# Patient Record
Sex: Female | Born: 1980 | Race: Black or African American | Hispanic: No | Marital: Single | Smoking: Never smoker
Health system: Southern US, Community
[De-identification: ages and names within clinical notes are randomized; demographics above are authoritative.]

## PROBLEM LIST (undated history)

## (undated) DIAGNOSIS — N289 Disorder of kidney and ureter, unspecified: Secondary | ICD-10-CM

---

## 2007-11-08 ENCOUNTER — Emergency Department (HOSPITAL_COMMUNITY): Admission: EM | Admit: 2007-11-08 | Discharge: 2007-11-09 | Payer: Self-pay | Admitting: Emergency Medicine

## 2007-12-20 ENCOUNTER — Emergency Department (HOSPITAL_COMMUNITY): Admission: EM | Admit: 2007-12-20 | Discharge: 2007-12-20 | Payer: Self-pay | Admitting: Family Medicine

## 2008-09-01 ENCOUNTER — Emergency Department (HOSPITAL_COMMUNITY): Admission: EM | Admit: 2008-09-01 | Discharge: 2008-09-01 | Payer: Self-pay | Admitting: Emergency Medicine

## 2009-03-28 ENCOUNTER — Emergency Department (HOSPITAL_COMMUNITY): Admission: EM | Admit: 2009-03-28 | Discharge: 2009-03-28 | Payer: Self-pay | Admitting: Emergency Medicine

## 2009-04-15 ENCOUNTER — Emergency Department (HOSPITAL_COMMUNITY): Admission: EM | Admit: 2009-04-15 | Discharge: 2009-04-15 | Payer: Self-pay | Admitting: Emergency Medicine

## 2010-02-18 ENCOUNTER — Emergency Department (HOSPITAL_COMMUNITY)
Admission: EM | Admit: 2010-02-18 | Discharge: 2010-02-18 | Payer: Self-pay | Source: Home / Self Care | Admitting: Family Medicine

## 2010-04-28 ENCOUNTER — Emergency Department (HOSPITAL_COMMUNITY): Payer: 59

## 2010-04-28 ENCOUNTER — Emergency Department (HOSPITAL_COMMUNITY)
Admission: EM | Admit: 2010-04-28 | Discharge: 2010-04-28 | Disposition: A | Discharge status: Home / Self Care | Payer: 59 | Attending: Emergency Medicine | Admitting: Emergency Medicine

## 2010-04-28 DIAGNOSIS — O200 Threatened abortion: Secondary | ICD-10-CM | POA: Insufficient documentation

## 2010-04-28 DIAGNOSIS — N949 Unspecified condition associated with female genital organs and menstrual cycle: Secondary | ICD-10-CM | POA: Insufficient documentation

## 2010-04-28 DIAGNOSIS — M545 Low back pain, unspecified: Secondary | ICD-10-CM | POA: Insufficient documentation

## 2010-04-28 LAB — CBC
MCH: 29.6 pg (ref 26.0–34.0)
Platelets: 253 10*3/uL (ref 150–400)
RDW: 13.2 % (ref 11.5–15.5)
WBC: 8.1 10*3/uL (ref 4.0–10.5)

## 2010-04-28 LAB — URINE MICROSCOPIC-ADD ON

## 2010-04-28 LAB — URINALYSIS, ROUTINE W REFLEX MICROSCOPIC
Ketones, ur: NEGATIVE mg/dL
Leukocytes, UA: NEGATIVE
Nitrite: NEGATIVE
Specific Gravity, Urine: 1.022 (ref 1.005–1.030)
Urobilinogen, UA: 0.2 mg/dL (ref 0.0–1.0)
pH: 6 (ref 5.0–8.0)

## 2010-04-28 LAB — WET PREP, GENITAL: Trich, Wet Prep: NONE SEEN

## 2010-04-28 LAB — ABO/RH: ABO/RH(D): O POS

## 2010-04-29 LAB — GC/CHLAMYDIA PROBE AMP, GENITAL: Chlamydia, DNA Probe: NEGATIVE

## 2010-05-02 LAB — DIFFERENTIAL
Basophils Relative: 0 % (ref 0–1)
Eosinophils Absolute: 0.1 10*3/uL (ref 0.0–0.7)
Lymphocytes Relative: 25 % (ref 12–46)
Neutro Abs: 7 10*3/uL (ref 1.7–7.7)

## 2010-05-02 LAB — CBC
MCHC: 34.7 g/dL (ref 30.0–36.0)
RBC: 3.63 MIL/uL — ABNORMAL LOW (ref 3.87–5.11)
RDW: 13.4 % (ref 11.5–15.5)
WBC: 10.1 10*3/uL (ref 4.0–10.5)

## 2010-05-02 LAB — POCT I-STAT, CHEM 8
BUN: 7 mg/dL (ref 6–23)
Glucose, Bld: 93 mg/dL (ref 70–99)
Hemoglobin: 11.6 g/dL — ABNORMAL LOW (ref 12.0–15.0)
TCO2: 23 mmol/L (ref 0–100)

## 2010-05-02 LAB — URINE MICROSCOPIC-ADD ON

## 2010-05-02 LAB — URINALYSIS, ROUTINE W REFLEX MICROSCOPIC
Glucose, UA: NEGATIVE mg/dL
Ketones, ur: NEGATIVE mg/dL
Leukocytes, UA: NEGATIVE
Nitrite: NEGATIVE
Protein, ur: NEGATIVE mg/dL

## 2010-05-11 ENCOUNTER — Other Ambulatory Visit (HOSPITAL_COMMUNITY): Payer: Self-pay | Admitting: Obstetrics and Gynecology

## 2010-05-11 DIAGNOSIS — O3680X0 Pregnancy with inconclusive fetal viability, not applicable or unspecified: Secondary | ICD-10-CM

## 2010-05-12 ENCOUNTER — Ambulatory Visit (HOSPITAL_COMMUNITY)
Admission: RE | Admit: 2010-05-12 | Discharge: 2010-05-12 | Disposition: A | Discharge status: Home / Self Care | Payer: 59 | Source: Ambulatory Visit | Attending: Obstetrics and Gynecology | Admitting: Obstetrics and Gynecology

## 2010-05-12 ENCOUNTER — Other Ambulatory Visit (HOSPITAL_COMMUNITY): Payer: Self-pay | Admitting: Obstetrics and Gynecology

## 2010-05-12 DIAGNOSIS — O3680X0 Pregnancy with inconclusive fetal viability, not applicable or unspecified: Secondary | ICD-10-CM

## 2010-05-12 DIAGNOSIS — Z3689 Encounter for other specified antenatal screening: Secondary | ICD-10-CM | POA: Insufficient documentation

## 2010-11-09 LAB — POCT URINALYSIS DIP (DEVICE)
Nitrite: NEGATIVE
Protein, ur: 30 mg/dL — AB
Urobilinogen, UA: 0.2 mg/dL (ref 0.0–1.0)
pH: 7.5 (ref 5.0–8.0)

## 2010-11-09 LAB — WET PREP, GENITAL
Trich, Wet Prep: NONE SEEN
Yeast Wet Prep HPF POC: NONE SEEN

## 2011-04-13 ENCOUNTER — Encounter (INDEPENDENT_AMBULATORY_CARE_PROVIDER_SITE_OTHER): Payer: Self-pay | Admitting: Surgery

## 2011-04-13 ENCOUNTER — Encounter (INDEPENDENT_AMBULATORY_CARE_PROVIDER_SITE_OTHER): Payer: 59 | Admitting: Surgery

## 2011-04-14 ENCOUNTER — Telehealth (INDEPENDENT_AMBULATORY_CARE_PROVIDER_SITE_OTHER): Payer: Self-pay | Admitting: Surgery

## 2011-04-14 ENCOUNTER — Ambulatory Visit (INDEPENDENT_AMBULATORY_CARE_PROVIDER_SITE_OTHER): Payer: 59 | Admitting: Surgery

## 2011-04-14 ENCOUNTER — Encounter (INDEPENDENT_AMBULATORY_CARE_PROVIDER_SITE_OTHER): Payer: Self-pay | Admitting: Surgery

## 2011-04-14 VITALS — BP 88/67 | HR 68 | Temp 98.0°F | Resp 12 | Ht 64.0 in | Wt 126.8 lb

## 2011-04-14 DIAGNOSIS — K602 Anal fissure, unspecified: Secondary | ICD-10-CM

## 2011-04-14 NOTE — Progress Notes (Signed)
[facility]  Ovidio Kin, MD,  FACS [address] [place]  [address] [place], [place]    [address] Phone:  [phone] FAX:  [phone]   Re:   Katlyne Nishida DOB:   09-04-1980 MRN:   [id number]  ASSESSMENT AND PLAN: 1.  Rectal pain - posterior anal fissure.  Plan stool softeners, Diltiazem cream, sitz baths at least 3 x per day, drink plenty of liquids, increase the fiber in your diet.  Given literature on rectal disease.  See me back in one month to see how she is doing.  2.  History of kidney stones.  Unsure of urologist name.  HISTORY OF PRESENT ILLNESS: Chief Complaint  Patient presents with  . Rectal Pain    Lynett Ades is a 31 y.o. (DOB: 1980/04/14)  AA female who is a patient of Dr. Ronnell Freshwater and comes to me today for rectal pain.  The patient sees Dr. Hilbert Bible or her medical problems.  About 4 weeks ago she noticed increasing rectal pain. She has had rectal bleeding on and off for some time. She had a colonoscopy 4 years ago in [facility] for the rectal bleeding.  She admits to having a little bit of hard stools normally.  The pain is increased and she has tried Preparation H and Tucks. She tried ice pack to her bottom. She could not tolerate suppositories. She has tried sitz baths, but can only get 2 in per day. She saw Dr. Hilbert Bible yesterday who referred her to her office. She has had no prior history of hemorrhoids or anal fissure.  ROS:  History of kidney stones.  Had lithotripsy about 2 years ago.  Cannot remember the name of her urologist.  Social:  Unmarried.  2 children - ages 21 and 35. 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  PHYSICAL EXAM: BP 88/67  Pulse 68  Temp(Src) 98 F (36.7 C) (Temporal)  Resp 12  Ht 5\' 4"  (1.626 m)  Wt 126 lb 12.8 oz (57.516 kg)  BMI 21.77 kg/m2  General: AA who is alert and generally healthy appearing.  HEENT: Normal. Pupils equal. Good dentition.  Lungs: Clear to auscultation and symmetric  breath sounds. Heart:  RRR. No murmur or rub. Abdomen: Soft. No mass. No tenderness. No hernia. Normal bowel sounds.  No abdominal scars. Rectal: Significant rectal spasm with evidence of posterior anal fissure.  No hemorrhoids.  I could not do much of a rectal exam because of the pain.  Extremities:  Good strength and ROM  in upper and lower extremities.  Tattoos arms, back and legs. Neurologic:  Grossly intact to motor and sensory function. Psychiatric: Has normal mood and affect. Behavior is normal.   DATA REVIEWED: Dr. Ebony Hail note  Ovidio Kin, MD, FACS Office:  [phone]

## 2011-04-14 NOTE — Patient Instructions (Signed)
1. Diltiazem cream 2% to rectum 4 times per day.   2. Stool softener - Colace.   3. Sitz bath 3 times per day (use the tub).   4. Topical agents like Prep H - okay.   

## 2011-05-04 ENCOUNTER — Telehealth (INDEPENDENT_AMBULATORY_CARE_PROVIDER_SITE_OTHER): Payer: Self-pay

## 2011-05-04 NOTE — Telephone Encounter (Signed)
The patient called to request a refill on her Diltiazem because the fissure has flared up.  I called in a refill of the 2% Cream for 30 days plus 5 refills per Dr Ezzard Standing to [facility] [phone].  Pt aware.

## 2011-05-17 ENCOUNTER — Encounter (INDEPENDENT_AMBULATORY_CARE_PROVIDER_SITE_OTHER): Payer: 59 | Admitting: Surgery

## 2011-07-27 ENCOUNTER — Encounter (INDEPENDENT_AMBULATORY_CARE_PROVIDER_SITE_OTHER): Payer: 59 | Admitting: Surgery

## 2011-08-02 ENCOUNTER — Encounter (INDEPENDENT_AMBULATORY_CARE_PROVIDER_SITE_OTHER): Payer: Self-pay | Admitting: Surgery

## 2014-03-12 ENCOUNTER — Encounter (HOSPITAL_COMMUNITY): Payer: Self-pay | Admitting: Emergency Medicine

## 2014-03-12 ENCOUNTER — Emergency Department (HOSPITAL_COMMUNITY)
Admission: EM | Admit: 2014-03-12 | Discharge: 2014-03-12 | Disposition: A | Discharge status: Home / Self Care | Payer: Managed Care, Other (non HMO) | Attending: Emergency Medicine | Admitting: Emergency Medicine

## 2014-03-12 DIAGNOSIS — T402X1A Poisoning by other opioids, accidental (unintentional), initial encounter: Secondary | ICD-10-CM | POA: Insufficient documentation

## 2014-03-12 DIAGNOSIS — T50905A Adverse effect of unspecified drugs, medicaments and biological substances, initial encounter: Secondary | ICD-10-CM

## 2014-03-12 DIAGNOSIS — Y9289 Other specified places as the place of occurrence of the external cause: Secondary | ICD-10-CM | POA: Insufficient documentation

## 2014-03-12 DIAGNOSIS — Y998 Other external cause status: Secondary | ICD-10-CM | POA: Insufficient documentation

## 2014-03-12 DIAGNOSIS — Y9389 Activity, other specified: Secondary | ICD-10-CM | POA: Insufficient documentation

## 2014-03-12 DIAGNOSIS — X58XXXA Exposure to other specified factors, initial encounter: Secondary | ICD-10-CM | POA: Insufficient documentation

## 2014-03-12 NOTE — ED Provider Notes (Signed)
CSN: [id number]     Arrival date & time 03/12/14  1215 History   First MD Initiated Contact with Patient 03/12/14 1300     Chief Complaint  Patient presents with  . Drug Overdose   HPI The patient states she took 2 hydrocodone today because she wanted to get some rest. Patient had an old prescription in the house.   The patient's friend who resides with her found her asleep and could not wake her up. Is concerned that something was wrong. He called 911. Patient thinks he may have overreacted.  The patient denies any complaints. She denies any suicidal or homicidal ideation. She was not taking these medications to harm herself. Nothing is actually hurting her either. Patient does not want to be in the emergency room and is asking if she can go home. History reviewed. No pertinent past medical history. History reviewed. No pertinent past surgical history. No family history on file. History  Substance Use Topics  . Smoking status: Never Smoker   . Smokeless tobacco: Not on file  . Alcohol Use: No   OB History    No data available     Review of Systems  All other systems reviewed and are negative.     Allergies  Review of patient's allergies indicates no known allergies.  Home Medications   Prior to Admission medications   Medication Sig Start Date End Date Taking? Authorizing Provider  Multiple Vitamin (MULTIVITAMIN WITH MINERALS) TABS tablet Take 1 tablet by mouth daily.   Yes Historical Provider, MD   BP 111/68 mmHg  Pulse 61  Temp(Src) 99 F (37.2 C) (Oral)  Resp 16  SpO2 99% Physical Exam  Constitutional: She appears well-developed and well-nourished. No distress.  HENT:  Head: Normocephalic and atraumatic.  Right Ear: External ear normal.  Left Ear: External ear normal.  Eyes: Conjunctivae are normal. Right eye exhibits no discharge. Left eye exhibits no discharge. No scleral icterus.  Neck: Neck supple. No tracheal deviation present.  Cardiovascular: Normal rate,  regular rhythm and intact distal pulses.   Pulmonary/Chest: Effort normal and breath sounds normal. No stridor. No respiratory distress. She has no wheezes. She has no rales.  Abdominal: Soft. Bowel sounds are normal. She exhibits no distension. There is no tenderness. There is no rebound and no guarding.  Musculoskeletal: She exhibits no edema or tenderness.  Neurological: She is alert. She has normal strength. No cranial nerve deficit (no facial droop, extraocular movements intact, no slurred speech) or sensory deficit. She exhibits normal muscle tone. She displays no seizure activity. Coordination normal.  Skin: Skin is warm and dry. No rash noted.  Psychiatric: She has a normal mood and affect. Her speech is not rapid and/or pressured, not delayed and not tangential. She is not withdrawn. Thought content is not delusional. She expresses no homicidal and no suicidal ideation.  Nursing note and vitals reviewed.   ED Course  Procedures (including critical care time) Labs Review Labs Reviewed - No data to display  Imaging Review No results found.   EKG Interpretation None      MDM   Final diagnoses:  Medication adverse effect, initial encounter    Patient is denying suicidal or homicidal ideation. Alert and awake during my exam. The patient's friend also is not concerned that she was trying to harm herself.  The patient contracts for safety. She does not want any further medical examination. I think this is reasonable  At this time there does not appear to  be any evidence of an acute emergency medical condition and the patient appears stable for discharge with appropriate outpatient follow up.    Linwood DibblesJon Romeka Scifres, MD 03/12/14 [phone]

## 2014-03-12 NOTE — ED Notes (Addendum)
From home via [facility], pt sts she took 2 of her own hydrocodone in order to sleep and that a friend called EMS bc she was excessively sedated. Denies SI/HI to EMS and ED staff.  With stimulation from EMS, pt awake, ambulatory and with stable vitals, CBG 101, 100% on RA.

## 2014-03-12 NOTE — Discharge Instructions (Signed)
Diphenhydramine capsules or tablets [Insomnia] What is this medicine? DIPHENHYDRAMINE (dye fen HYE dra meen) is an antihistamine. This medicine is used to treat occasional sleeplesness. This medicine may be used for other purposes; ask your health care provider or pharmacist if you have questions. COMMON BRAND NAME(S): Aid to Sleep, Compoz Nighttime Sleep Aid, Nighttime Sleep Aid, Nytol, Simply Sleep, Sleep Tabs, Sominex, Unisom, Vicks Qlearquil Nighttime Allergy Relief, Vicks ZzzQuil Nightime Sleep-Aid What should I tell my health care provider before I take this medicine? They need to know if you have any of these conditions: -glaucoma -high blood pressure or heart disease -liver disease -lung or breathing disease, like asthma -pain or trouble passing urine -prostate trouble -ulcers or other stomach problems -an unusual or allergic reaction to diphenhydramine, other medicines foods, dyes, or preservatives such as sulfites -pregnant or trying to get pregnant -breast-feeding How should I use this medicine? Take this medicine by mouth with a full glass of water. Follow the directions on the prescription label. Do not take your medicine more often than directed. Talk to your pediatrician regarding the use of this medicine in children. While this drug may be prescribed for children as young as 12 years old for selected conditions, precautions do apply. Patients over 65 years old may have a stronger reaction and need a smaller dose. Overdosage: If you think you have taken too much of this medicine contact a poison control center or emergency room at once. NOTE: This medicine is only for you. Do not share this medicine with others. What if I miss a dose? If you miss a dose, take it as soon as you can. If it is almost time for your next dose, take only that dose. Do not take double or extra doses. What may interact with this medicine? Do not take this medicine with any of the following  medications: -MAOIs like Carbex, Eldepryl, Marplan, Nardil, and Parnate This medicine may also interact with the following medications: -alcohol -barbiturates like phenobarbital -medicines for bladder spasm like oxybutynin, tolterodine -medicines for blood pressure -medicines for depression, anxiety, or psychotic disturbances -medicines for movement abnormalities or Parkinson's disease -medicines for sleep -other medicines for cold, cough, or allergy -some medicines for the stomach like chlordiazepoxide, dicyclomine This list may not describe all possible interactions. Give your health care provider a list of all the medicines, herbs, non-prescription drugs, or dietary supplements you use. Also tell them if you smoke, drink alcohol, or use illegal drugs. Some items may interact with your medicine. What should I watch for while using this medicine? Visit your doctor or health care professional for regular check ups. Tell your doctor or health care professional if your symptoms do not start to get better or if they get worse. Your mouth may get dry. Chewing sugarless gum or sucking hard candy, and drinking plenty of water may help. Contact your doctor if the problem does not go away or is severe. This medicine may cause dry eyes and blurred vision. If you wear contact lenses you may feel some discomfort. Lubricating drops may help. See your eye doctor if the problem does not go away or is severe. You may get drowsy or dizzy. Do not drive, use machinery, or do anything that needs mental alertness until you know how this medicine affects you. Do not stand or sit up quickly, especially if you are an older patient. This reduces the risk of dizzy or fainting spells. Alcohol may interfere with the effect of this medicine. Avoid alcoholic   drinks. What side effects may I notice from receiving this medicine? Side effects that you should report to your doctor or health care professional as soon as  possible: -allergic reactions like skin rash, itching or hives, swelling of the face, lips, or tongue -changes in vision -confused, agitated, or nervous -fast, irregular heartbeat -tremor -trouble passing urine or change in the amount of urine -unusual bleeding or bruising -unusually weak or tired Side effects that usually do not require medical attention (report to your doctor or health care professional if they continue or are bothersome): -constipation, diarrhea -drowsy -headache -loss of appetite -stomach upset, vomiting -thick mucus This list may not describe all possible side effects. Call your doctor for medical advice about side effects. You may report side effects to FDA at 1-800-FDA-1088. Where should I keep my medicine? Keep out of the reach of children. Store at room temperature between 20 and 25 degrees C (68 and 77 degrees F). Keep container closed tightly. Throw away any unused medicine after the expiration date. NOTE: This sheet is a summary. It may not cover all possible information. If you have questions about this medicine, talk to your doctor, pharmacist, or health care provider.  2015, Elsevier/Gold Standard. (2007-05-25 16:14:00) Insomnia Insomnia is frequent trouble falling and/or staying asleep. Insomnia can be a long term problem or a short term problem. Both are common. Insomnia can be a short term problem when the wakefulness is related to a certain stress or worry. Long term insomnia is often related to ongoing stress during waking hours and/or poor sleeping habits. Overtime, sleep deprivation itself can make the problem worse. Every little thing feels more severe because you are overtired and your ability to cope is decreased. CAUSES   Stress, anxiety, and depression.  Poor sleeping habits.  Distractions such as TV in the bedroom.  Naps close to bedtime.  Engaging in emotionally charged conversations before bed.  Technical reading before  sleep.  Alcohol and other sedatives. They may make the problem worse. They can hurt normal sleep patterns and normal dream activity.  Stimulants such as caffeine for several hours prior to bedtime.  Pain syndromes and shortness of breath can cause insomnia.  Exercise late at night.  Changing time zones may cause sleeping problems (jet lag). It is sometimes helpful to have someone observe your sleeping patterns. They should look for periods of not breathing during the night (sleep apnea). They should also look to see how long those periods last. If you live alone or observers are uncertain, you can also be observed at a sleep clinic where your sleep patterns will be professionally monitored. Sleep apnea requires a checkup and treatment. Give your caregivers your medical history. Give your caregivers observations your family has made about your sleep.  SYMPTOMS   Not feeling rested in the morning.  Anxiety and restlessness at bedtime.  Difficulty falling and staying asleep. TREATMENT   Your caregiver may prescribe treatment for an underlying medical disorders. Your caregiver can give advice or help if you are using alcohol or other drugs for self-medication. Treatment of underlying problems will usually eliminate insomnia problems.  Medications can be prescribed for short time use. They are generally not recommended for lengthy use.  Over-the-counter sleep medicines are not recommended for lengthy use. They can be habit forming.  You can promote easier sleeping by making lifestyle changes such as:  Using relaxation techniques that help with breathing and reduce muscle tension.  Exercising earlier in the day.  Changing your   diet and the time of your last meal. No night time snacks.  Establish a regular time to go to bed.  Counseling can help with stressful problems and worry.  Soothing music and white noise may be helpful if there are background noises you cannot remove.  Stop  tedious detailed work at least one hour before bedtime. HOME CARE INSTRUCTIONS   Keep a diary. Inform your caregiver about your progress. This includes any medication side effects. See your caregiver regularly. Take note of:  Times when you are asleep.  Times when you are awake during the night.  The quality of your sleep.  How you feel the next day. This information will help your caregiver care for you.  Get out of bed if you are still awake after 15 minutes. Read or do some quiet activity. Keep the lights down. Wait until you feel sleepy and go back to bed.  Keep regular sleeping and waking hours. Avoid naps.  Exercise regularly.  Avoid distractions at bedtime. Distractions include watching television or engaging in any intense or detailed activity like attempting to balance the household checkbook.  Develop a bedtime ritual. Keep a familiar routine of bathing, brushing your teeth, climbing into bed at the same time each night, listening to soothing music. Routines increase the success of falling to sleep faster.  Use relaxation techniques. This can be using breathing and muscle tension release routines. It can also include visualizing peaceful scenes. You can also help control troubling or intruding thoughts by keeping your mind occupied with boring or repetitive thoughts like the old concept of counting sheep. You can make it more creative like imagining planting one beautiful flower after another in your backyard garden.  During your day, work to eliminate stress. When this is not possible use some of the previous suggestions to help reduce the anxiety that accompanies stressful situations. MAKE SURE YOU:   Understand these instructions.  Will watch your condition.  Will get help right away if you are not doing well or get worse. Document Released: 01/22/2000 Document Revised: 04/18/2011 Document Reviewed: 02/21/2007 ExitCare Patient Information 2015 [employer]. This  information is not intended to replace advice given to you by your health care provider. Make sure you discuss any questions you have with your health care provider.  

## 2017-01-11 ENCOUNTER — Other Ambulatory Visit: Payer: Self-pay | Admitting: Obstetrics and Gynecology

## 2017-01-11 DIAGNOSIS — N6310 Unspecified lump in the right breast, unspecified quadrant: Secondary | ICD-10-CM

## 2017-01-14 ENCOUNTER — Other Ambulatory Visit: Payer: Self-pay | Admitting: Obstetrics and Gynecology

## 2017-01-14 DIAGNOSIS — N6310 Unspecified lump in the right breast, unspecified quadrant: Secondary | ICD-10-CM

## 2017-01-20 ENCOUNTER — Ambulatory Visit
Admission: RE | Admit: 2017-01-20 | Discharge: 2017-01-20 | Disposition: A | Discharge status: Home / Self Care | Payer: Managed Care, Other (non HMO) | Source: Ambulatory Visit | Attending: Obstetrics and Gynecology | Admitting: Obstetrics and Gynecology

## 2017-01-20 ENCOUNTER — Ambulatory Visit
Admission: RE | Admit: 2017-01-20 | Discharge: 2017-01-20 | Disposition: A | Discharge status: Home / Self Care | Payer: 59 | Source: Ambulatory Visit | Attending: Obstetrics and Gynecology | Admitting: Obstetrics and Gynecology

## 2017-01-20 ENCOUNTER — Other Ambulatory Visit: Payer: Self-pay | Admitting: Obstetrics and Gynecology

## 2017-01-20 DIAGNOSIS — N6310 Unspecified lump in the right breast, unspecified quadrant: Secondary | ICD-10-CM

## 2017-01-25 ENCOUNTER — Ambulatory Visit
Admission: RE | Admit: 2017-01-25 | Discharge: 2017-01-25 | Disposition: A | Discharge status: Home / Self Care | Payer: 59 | Source: Ambulatory Visit | Attending: Obstetrics and Gynecology | Admitting: Obstetrics and Gynecology

## 2017-01-25 ENCOUNTER — Other Ambulatory Visit: Payer: Self-pay | Admitting: Obstetrics and Gynecology

## 2017-01-25 ENCOUNTER — Other Ambulatory Visit (HOSPITAL_COMMUNITY)
Admission: RE | Admit: 2017-01-25 | Discharge: 2017-01-25 | Disposition: A | Discharge status: Home / Self Care | Payer: 59 | Source: Ambulatory Visit | Attending: Obstetrics and Gynecology | Admitting: Obstetrics and Gynecology

## 2017-01-25 DIAGNOSIS — N6310 Unspecified lump in the right breast, unspecified quadrant: Secondary | ICD-10-CM

## 2017-01-25 DIAGNOSIS — N6312 Unspecified lump in the right breast, upper inner quadrant: Secondary | ICD-10-CM | POA: Diagnosis present

## 2017-05-30 ENCOUNTER — Emergency Department (HOSPITAL_COMMUNITY)
Admission: EM | Admit: 2017-05-30 | Discharge: 2017-05-30 | Disposition: A | Discharge status: Home / Self Care | Payer: 59 | Attending: Emergency Medicine | Admitting: Emergency Medicine

## 2017-05-30 ENCOUNTER — Encounter (HOSPITAL_COMMUNITY): Payer: Self-pay | Admitting: *Deleted

## 2017-05-30 ENCOUNTER — Other Ambulatory Visit: Payer: Self-pay

## 2017-05-30 DIAGNOSIS — R1013 Epigastric pain: Secondary | ICD-10-CM

## 2017-05-30 HISTORY — DX: Disorder of kidney and ureter, unspecified: N28.9

## 2017-05-30 LAB — COMPREHENSIVE METABOLIC PANEL
ALBUMIN: 3.8 g/dL (ref 3.5–5.0)
ALT: 14 U/L (ref 14–54)
ANION GAP: 7 (ref 5–15)
AST: 19 U/L (ref 15–41)
Alkaline Phosphatase: 42 U/L (ref 38–126)
BILIRUBIN TOTAL: 0.7 mg/dL (ref 0.3–1.2)
BUN: 15 mg/dL (ref 6–20)
CO2: 24 mmol/L (ref 22–32)
Calcium: 8.9 mg/dL (ref 8.9–10.3)
Chloride: 105 mmol/L (ref 101–111)
Creatinine, Ser: 0.87 mg/dL (ref 0.44–1.00)
GFR calc Af Amer: >60 mL/min (ref >60)
Glucose, Bld: 84 mg/dL (ref 65–99)
POTASSIUM: 4 mmol/L (ref 3.5–5.1)
Sodium: 136 mmol/L (ref 135–145)
TOTAL PROTEIN: 6.5 g/dL (ref 6.5–8.1)

## 2017-05-30 LAB — CBC
HEMATOCRIT: 34.4 % — AB (ref 36.0–46.0)
HEMOGLOBIN: 11.5 g/dL — AB (ref 12.0–15.0)
MCH: 29.9 pg (ref 26.0–34.0)
MCHC: 33.4 g/dL (ref 30.0–36.0)
MCV: 89.4 fL (ref 78.0–100.0)
Platelets: 235 10*3/uL (ref 150–400)
RBC: 3.85 MIL/uL — AB (ref 3.87–5.11)
RDW: 13 % (ref 11.5–15.5)
WBC: 6.6 10*3/uL (ref 4.0–10.5)

## 2017-05-30 LAB — I-STAT BETA HCG BLOOD, ED (MC, WL, AP ONLY): I-stat hCG, quantitative: <5 m[IU]/mL (ref <5)

## 2017-05-30 LAB — LIPASE, BLOOD: Lipase: 31 U/L (ref 11–51)

## 2017-05-30 MED ORDER — GI COCKTAIL ~~LOC~~
30.0000 mL | Freq: Once | ORAL | Status: AC
  Administered 2017-05-30: 30 mL via ORAL
  Filled 2017-05-30: qty 30

## 2017-05-30 MED ORDER — FAMOTIDINE 20 MG PO TABS
40.0000 mg | ORAL_TABLET | Freq: Once | ORAL | Status: AC
  Administered 2017-05-30: 40 mg via ORAL
  Filled 2017-05-30: qty 2

## 2017-05-30 NOTE — ED Notes (Signed)
ED Provider at bedside. 

## 2017-05-30 NOTE — Discharge Instructions (Addendum)
Your blood work was reassuring today.  As we discussed, please call to establish care with a regular doctor to have checkup.  You can use the information listed at the back of this packet to help you find a regular doctor.  Please take over-the-counter Prilosec daily to see if this helps with your symptoms.  Return to the emergency department if you have any new or concerning symptoms like fever greater than 100.4 F, vomiting that will not stop, blood in the stool.

## 2017-05-30 NOTE — ED Triage Notes (Signed)
To ED for eval of abd/epigastric pain for a month. States her pain is pinpointed and 'gets bigger after I eat'. No vomiting. Constipation. [facility] and told she doesn't have an ulcer from a finger stick that they did. Appears in nad

## 2017-05-30 NOTE — ED Provider Notes (Signed)
[facility] Provider Note   CSN: [id number] Arrival date & time: 05/30/17  [id number]     History   Chief Complaint Chief Complaint  Patient presents with  . Abdominal Pain    HPI Carol Robinson is a 37 y.o. female.  HPI  Carol Robinson is a 37 year old female with a history of kidney stones who presents to the emergency [facility] for evaluation of epigastric pain.  Patient reports that pain began about a month ago.  Reports pain is only present after eating or with lifting at the gym.  She states that she feels as if there is a lump over her upper abdomen which feels bigger when she eats.  Patient denies pain at this time, but states that when she is eating it becomes a 8/10 in severity aching pain.  States that she has some constipation, last bowel movement yesterday.  She was seen at the urgent care clinic 2 weeks ago and was told that her symptoms are likely gastritis and she should start taking acid reducing medication.  Patient has not tried this as the medication was $200 and she wants to get a definitive diagnosis prior to spending that much money on a medication (for which she cannot remember the name.) She denies fevers, chills, nausea/vomiting, diarrhea, melena, hematochezia, vaginal discharge, chest pain, shortness of breath, lightheadedness, syncope.  She has not had any prior abdominal surgeries.  She has had two pregnancies.   Past Medical History:  Diagnosis Date  . Renal disorder    kidney stones    Patient Active Problem List   Diagnosis Date Noted  . Anal fissure 04/14/2011    History reviewed. No pertinent surgical history.   OB History   None      Home Medications    Prior to Admission medications   Medication Sig Start Date End Date Taking? Authorizing Provider  Multiple Vitamin (MULTIVITAMIN WITH MINERALS) TABS tablet Take 1 tablet by mouth daily.    [provider]    Family History No family history on  file.  Social History Social History   Tobacco Use  . Smoking status: Never Smoker  Substance Use Topics  . Alcohol use: No  . Drug use: No     Allergies   Patient has no known allergies.   Review of Systems Review of Systems  Constitutional: Negative for chills and fever.  Eyes: Negative for visual disturbance.  Respiratory: Negative for shortness of breath.   Cardiovascular: Negative for chest pain.  Gastrointestinal: Positive for abdominal pain (epigastric). Negative for diarrhea, nausea and vomiting.  Genitourinary: Negative for difficulty urinating, dysuria, flank pain, frequency, menstrual problem, vaginal bleeding and vaginal discharge.  Musculoskeletal: Negative for back pain.  Skin: Negative for rash.  Neurological: Negative for weakness, numbness and headaches.  Psychiatric/Behavioral: Negative for agitation.     Physical Exam Updated Vital Signs BP (!) 108/56 (BP Location: Right Arm)   Pulse (!) 53   Temp 99 F (37.2 C) (Oral)   Resp 16   SpO2 100%   Physical Exam  Constitutional: She appears well-developed and well-nourished. No distress.  Sitting at bedside in no apparent distress, nontoxic-appearing.  HENT:  Head: Normocephalic and atraumatic.  Mouth/Throat: Oropharynx is clear and moist. No oropharyngeal exudate.  Eyes: Pupils are equal, round, and reactive to light. Conjunctivae are normal. Right eye exhibits no discharge. Left eye exhibits no discharge.  Neck: Normal range of motion. Neck supple.  Cardiovascular: Normal rate, regular rhythm and  intact distal pulses. Exam reveals no friction rub.  No murmur heard. Pulmonary/Chest: Effort normal and breath sounds normal. No stridor. No respiratory distress. She has no wheezes. She has no rales.  Abdominal:  Abdomen soft. Palpable midline diastasis recti. No appreciable umbilical hernia. Mildly tender to palpation in the epigastrium. No guarding or rigidity.  No rebound tenderness.  No CVA  tenderness.  Negative Murphy sign.  Neurological: She is alert. Coordination normal.  Skin: Skin is warm and dry. Capillary refill takes less than 2 seconds. She is not diaphoretic.  Psychiatric: She has a normal mood and affect. Her behavior is normal.  Nursing note and vitals reviewed.    ED Treatments / Results  Labs (all labs ordered are listed, but only abnormal results are displayed) Labs Reviewed  CBC - Abnormal; Notable for the following components:      Result Value   RBC 3.85 (*)    Hemoglobin 11.5 (*)    HCT 34.4 (*)    All other components within normal limits  COMPREHENSIVE METABOLIC PANEL  LIPASE, BLOOD  I-STAT BETA HCG BLOOD, ED ([facility], [facility], [facility] ONLY)    EKG None  Radiology No results found.  Procedures Procedures (including critical care time)  Medications Ordered in ED Medications  gi cocktail (Maalox,Lidocaine,Donnatal) (30 mLs Oral Given 05/30/17 1214)  famotidine (PEPCID) tablet 40 mg (40 mg Oral Given 05/30/17 1213)     Initial Impression / Assessment and Plan / ED Course  I have reviewed the triage vital signs and the nursing notes.  Pertinent labs & imaging results that were available during my care of the patient were reviewed by me and considered in my medical decision making (see chart for details).     Patient presents with epigastric pain only after eating. Also reporting a bump in her epigastric area which gets large with eating or lifting weights. She denies pain currently, as she has not had anything to eat today. No associated fever, chills, n/v, diarrhea, urinary symptoms.   On exam she is afebrile and non-toxic appearing. She has a palpable diastasis recti, notably when she is sitting up and flexing her abdominal muscles. This is likely related to her feeling a bump in her epigastrium. No palpable umbilical hernias. She has some mild tenderness in the epigastrium, no guarding or rigidity.   Suspect gastritis given history of pain in the  epigastrium, only present with eating. Lab work reassuring. CBC unremarkable, no leukocytosis. Hemoglobin 11.5 which is baseline. No history of melena, hematochezia or coffee ground emesis and hgb stable, do not suspect bleeding peptic ulcer. CMP WNL, kidney function normal and liver enzymes normal. Lipase WNL. BetahCG WNL. No urine was collected, but patient denies urinary symptoms and do not suspect pyelonephritis, cystitis, nephrolithiasis given history.   Given presentation, exam findings, lab work do not suspect acute appendicitis, cholecystitis, perforated viscus, bowel obstruction, diverticulitis. Patient given pepcid and GI cocktail in the ED, on recheck she states she has no pain, but also states that she did not have pain prior to medicine as she has not eaten today. I discuss the lab results with her, that her symptoms most likely related to gastritis and I recommend trying acid reducing medication prior to eating at home. I'm not sure what she was prescribed at [facility] that was $200, but counseled her that she can try over the counter zantec or prilosec which is not expensive. Have given her information to establish care with a primary doctor and discussed importance of  having recheck. She agrees. Counseled her on return precautions and she also agrees and appears reliable for follow up.   Final Clinical Impressions(s) / ED Diagnoses   Final diagnoses:  Epigastric pain    ED Discharge Orders    None       Kellie Shropshire, PA-C 05/31/17 1223    Phillis Haggis, MD 06/01/17 [phone]

## 2017-08-18 ENCOUNTER — Emergency Department (HOSPITAL_COMMUNITY)
Admission: EM | Admit: 2017-08-18 | Discharge: 2017-08-19 | Disposition: A | Discharge status: Home / Self Care | Payer: 59 | Attending: Emergency Medicine | Admitting: Emergency Medicine

## 2017-08-18 ENCOUNTER — Encounter (HOSPITAL_COMMUNITY): Payer: Self-pay | Admitting: Emergency Medicine

## 2017-08-18 DIAGNOSIS — M545 Low back pain: Secondary | ICD-10-CM | POA: Diagnosis not present

## 2017-08-18 DIAGNOSIS — R102 Pelvic and perineal pain: Secondary | ICD-10-CM | POA: Diagnosis present

## 2017-08-18 LAB — WET PREP, GENITAL
CLUE CELLS WET PREP: NONE SEEN
TRICH WET PREP: NONE SEEN
Yeast Wet Prep HPF POC: NONE SEEN

## 2017-08-18 LAB — URINALYSIS, ROUTINE W REFLEX MICROSCOPIC
Bilirubin Urine: NEGATIVE
GLUCOSE, UA: NEGATIVE mg/dL
HGB URINE DIPSTICK: NEGATIVE
Ketones, ur: NEGATIVE mg/dL
LEUKOCYTES UA: NEGATIVE
Nitrite: NEGATIVE
PH: 5 (ref 5.0–8.0)
PROTEIN: NEGATIVE mg/dL
Specific Gravity, Urine: 1.025 (ref 1.005–1.030)

## 2017-08-18 LAB — POC URINE PREG, ED: PREG TEST UR: NEGATIVE

## 2017-08-18 MED ORDER — IBUPROFEN 800 MG PO TABS: 800.0000 mg | ORAL_TABLET | Freq: Three times a day (TID) | ORAL | 0 refills | Status: AC

## 2017-08-18 MED ORDER — ORPHENADRINE CITRATE ER 100 MG PO TB12: 100.0000 mg | ORAL_TABLET | Freq: Two times a day (BID) | ORAL | 0 refills | Status: AC

## 2017-08-18 NOTE — ED Notes (Signed)
Pt is aware a urine sample is needed, but is unable to provide one at this time. 

## 2017-08-18 NOTE — ED Triage Notes (Signed)
Patient c/o lower back pain and cramping x1 week. Hx kidney stones.

## 2017-08-18 NOTE — ED Provider Notes (Signed)
[facility]-EMERGENCY DEPT Provider Note   CSN: [id number] Arrival date & time: 08/18/17  1820     History   Chief Complaint Chief Complaint  Patient presents with  . Back Pain    HPI Carol Robinson is a 37 y.o. female.  HPI 1 week of lower abdominal pain and lower back pain.  Aching in quality.  Gradual in onset.  No change with movement or position.  No vaginal discharge or drainage.  Patient is midway between normal menstrual cycles.  Patient is sexually active.  Doubts STD.  Tried Motrin with minimal relief.  No nausea vomiting diarrhea or constipation.  No fevers or chills. Past Medical History:  Diagnosis Date  . Renal disorder    kidney stones    Patient Active Problem List   Diagnosis Date Noted  . Anal fissure 04/14/2011    History reviewed. No pertinent surgical history.   OB History   None      Home Medications    Prior to Admission medications   Medication Sig Start Date End Date Taking? Authorizing Provider  ibuprofen (ADVIL,MOTRIN) 800 MG tablet Take 1 tablet (800 mg total) by mouth 3 (three) times daily. 08/18/17   Arby Barrette, MD  orphenadrine (NORFLEX) 100 MG tablet Take 1 tablet (100 mg total) by mouth 2 (two) times daily. 08/18/17   Arby Barrette, MD    Family History No family history on file.  Social History Social History   Tobacco Use  . Smoking status: Never Smoker  Substance Use Topics  . Alcohol use: No  . Drug use: No     Allergies   Patient has no known allergies.   Review of Systems Review of Systems 10 Systems reviewed and are negative for acute change except as noted in the HPI.   Physical Exam Updated Vital Signs BP (!) 110/55 (BP Location: Left Arm)   Pulse (!) 54   Temp 98.6 F (37 C) (Oral)   Resp 18   Ht 5\' 4"  (1.626 m)   Wt 63.5 kg (140 lb)   LMP 07/30/2017   SpO2 100%   BMI 24.03 kg/m   Physical Exam  Constitutional: She is oriented to person, place, and time. She  appears well-developed and well-nourished.  HENT:  Head: Normocephalic and atraumatic.  Eyes: Pupils are equal, round, and reactive to light. EOM are normal.  Neck: Neck supple.  Cardiovascular: Normal rate, regular rhythm, normal heart sounds and intact distal pulses.  Pulmonary/Chest: Effort normal and breath sounds normal.  Abdominal: Soft. Bowel sounds are normal. She exhibits no distension. There is tenderness.  Mild tenderness very low suprapubic.  No guarding or mass.  No CVA tenderness.  Genitourinary:  Genitourinary Comments: Normal external female genitalia.  Normal cervix.  No significant cervical motion tenderness or uterine tenderness.  Mild bilateral adnexal tenderness.  Most discomfort with isolating pressure in the vaginal walls to the bladder.  Musculoskeletal: Normal range of motion. She exhibits no edema.  Neurological: She is alert and oriented to person, place, and time. She has normal strength. Coordination normal. GCS eye subscore is 4. GCS verbal subscore is 5. GCS motor subscore is 6.  Skin: Skin is warm, dry and intact.  Psychiatric: She has a normal mood and affect.     ED Treatments / Results  Labs (all labs ordered are listed, but only abnormal results are displayed) Labs Reviewed  WET PREP, GENITAL - Abnormal; Notable for the following components:  Result Value   WBC, Wet Prep HPF POC FEW (*)    All other components within normal limits  URINALYSIS, ROUTINE W REFLEX MICROSCOPIC  POC URINE PREG, ED  GC/CHLAMYDIA PROBE AMP ([place]) NOT [facility]    EKG None  Radiology No results found.  Procedures Procedures (including critical care time)  Medications Ordered in ED Medications - No data to display   Initial Impression / Assessment and Plan / ED Course  I have reviewed the triage vital signs and the nursing notes.  Pertinent labs & imaging results that were available during my care of the patient were reviewed by me and considered in my  medical decision making (see chart for details).      Final Clinical Impressions(s) / ED Diagnoses   Final diagnoses:  Pelvic pain in female  Patient clinically well.  She has only very low pelvic pain in the suprapubic area and diffuse very low back pain.  No other associated symptoms.  No vaginal discharge or irregular bleeding.  Urinalysis is negative, pregnancy negative.  Pelvic exam is not highly suggestive of PID or cervicitis.  At this time will await culture results before any empiric treatment.  Suggested ongoing ibuprofen and Norflex and follow-up with PCP or OB/GYN in the next 3 to 7 days.  Return precautions reviewed.  ED Discharge Orders        Ordered    ibuprofen (ADVIL,MOTRIN) 800 MG tablet  3 times daily     08/18/17 2316    orphenadrine (NORFLEX) 100 MG tablet  2 times daily     08/18/17 2316       Arby BarrettePfeiffer, Adriana Lina, MD 08/18/17 2320

## 2017-08-18 NOTE — Discharge Instructions (Addendum)
1. Take ibuprofen and Norflex for pain. 2.  Return to the emergency department if you develop fever, nausea vomiting or other concerning symptoms. 3.  Follow-up with your family doctor in 3 to 7 days.

## 2017-08-21 LAB — GC/CHLAMYDIA PROBE AMP (~~LOC~~) NOT AT ARMC
Chlamydia: NEGATIVE
NEISSERIA GONORRHEA: NEGATIVE

## 2018-02-04 IMAGING — MG 2D DIGITAL DIAGNOSTIC BILATERAL MAMMOGRAM WITH CAD AND ADJUNCT T
8 of 15 series · 8 of 35 positions shown · non-contrast
Comparison: None.

CLINICAL DATA: 36-year-old female presenting for baseline
mammography due to a palpable lump in the right breast identified on
clinical breast exam.

EXAM:
2D DIGITAL DIAGNOSTIC BILATERAL MAMMOGRAM WITH CAD AND ADJUNCT TOMO
RIGHT BREAST ULTRASOUND

[R TAN]
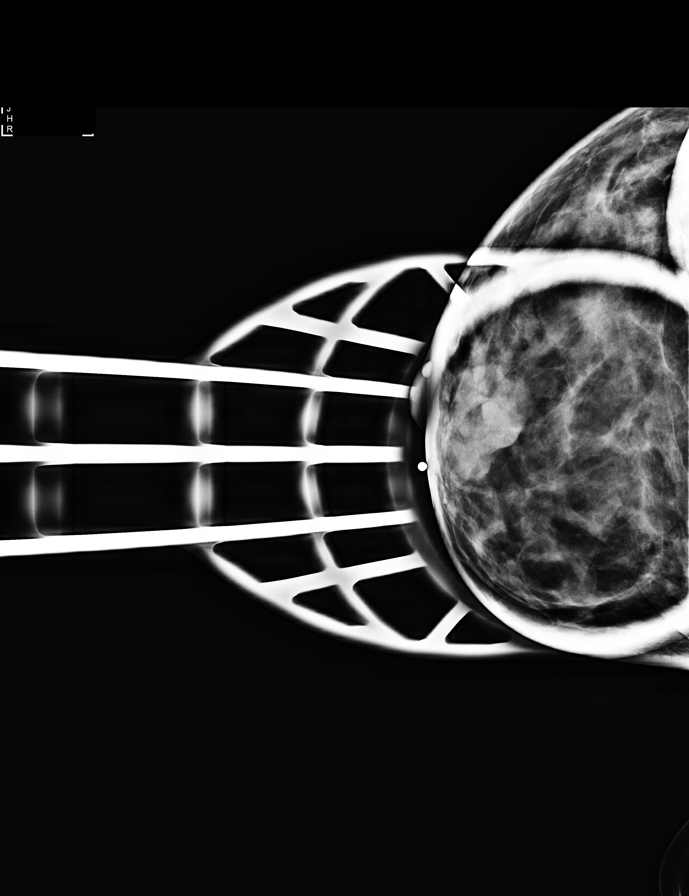

[L MLO]
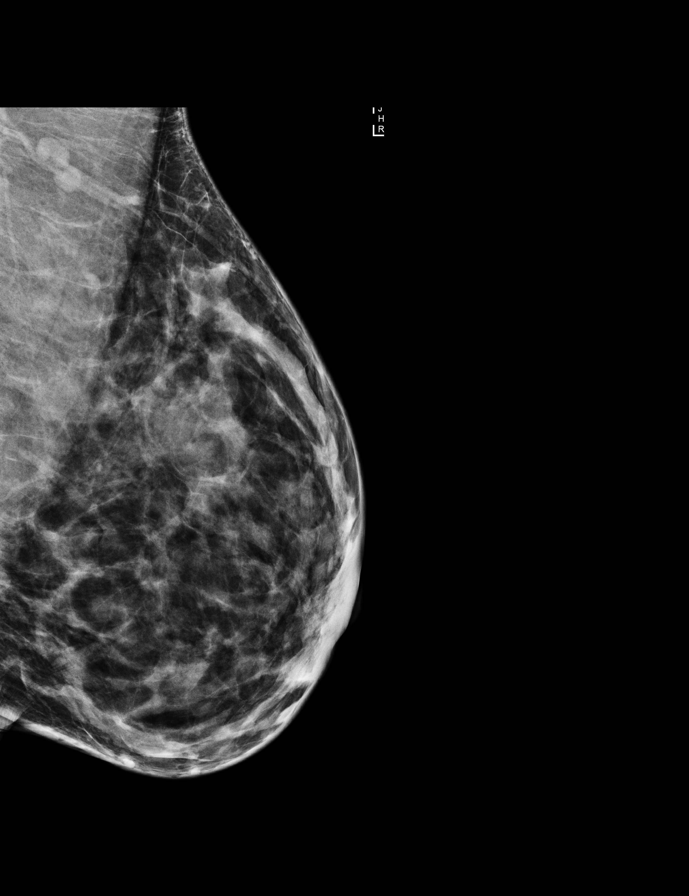

[R MLO synth-2D]
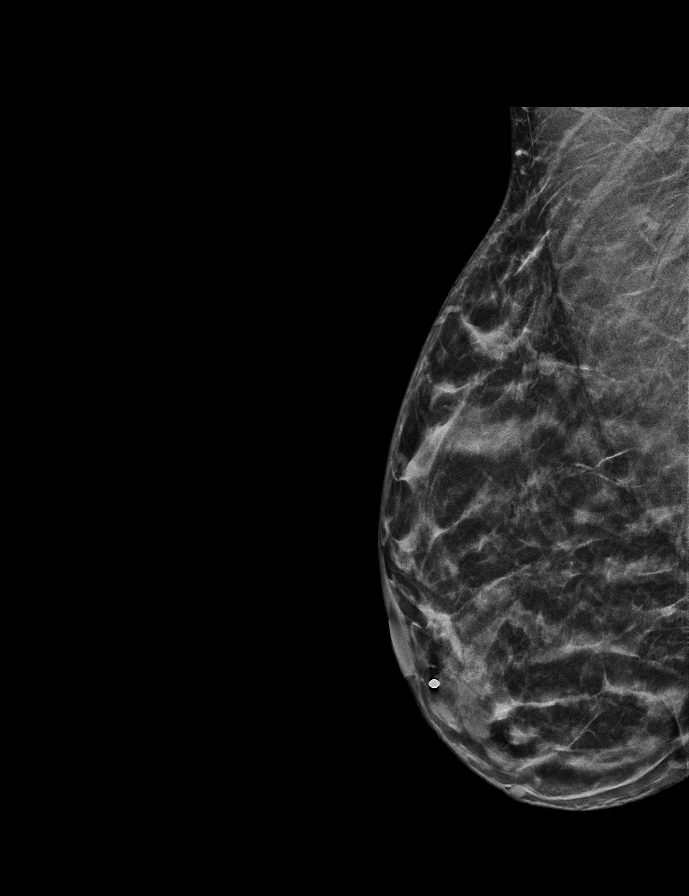

[R CC synth-2D]
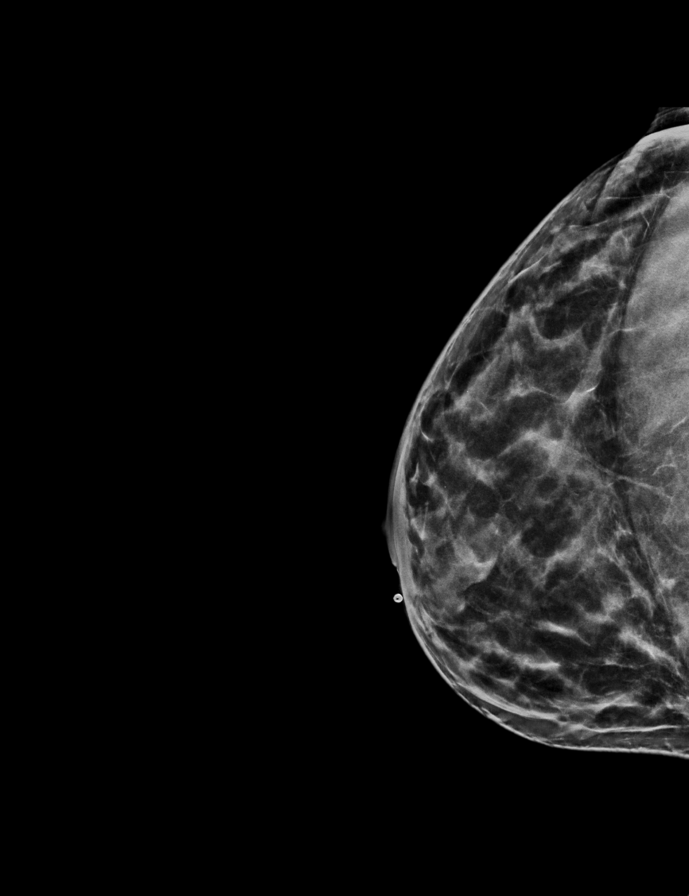

[R TAN synth-2D]
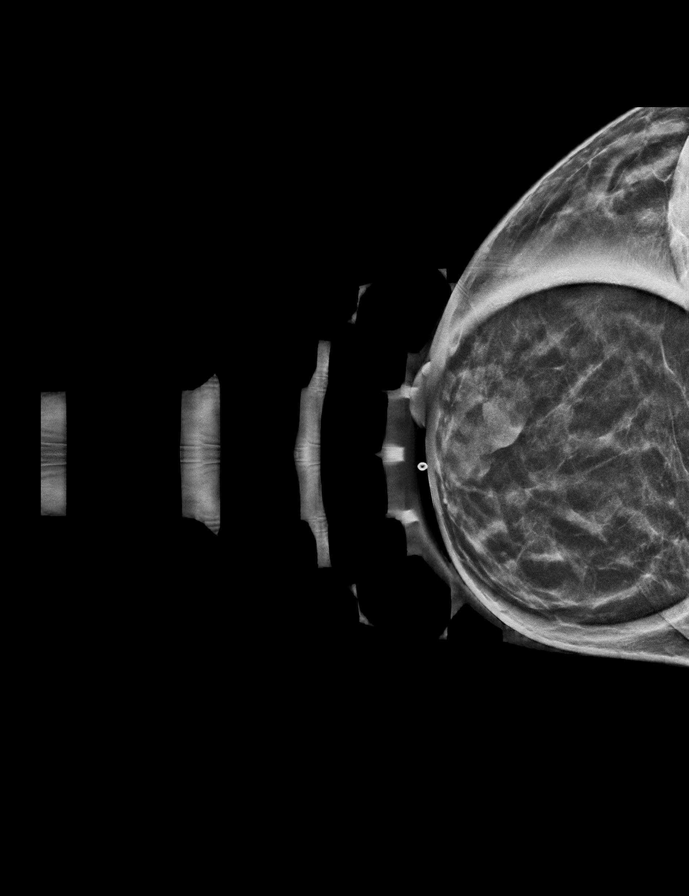

[R MLO]
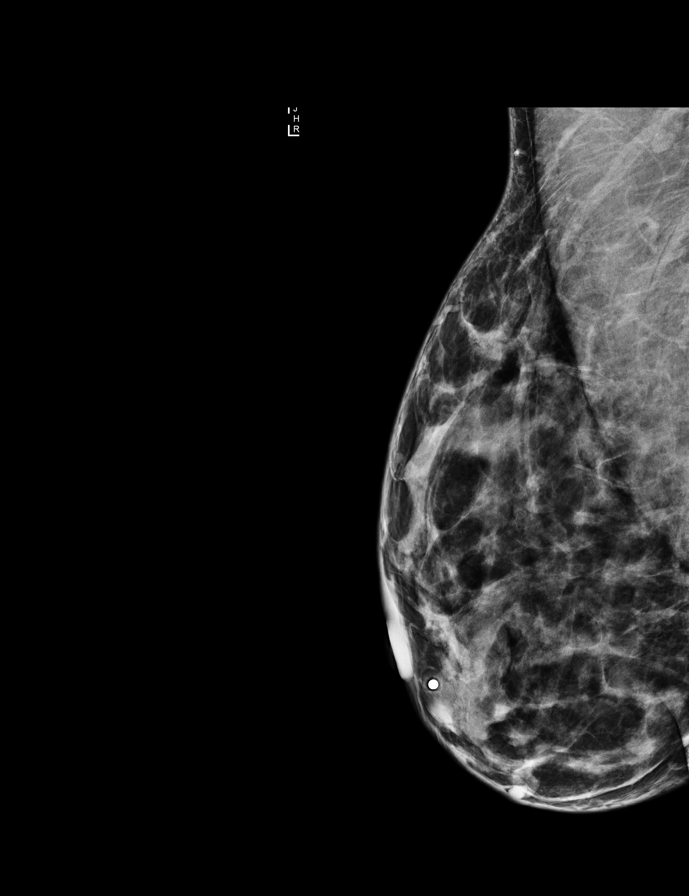

[L CC synth-2D]
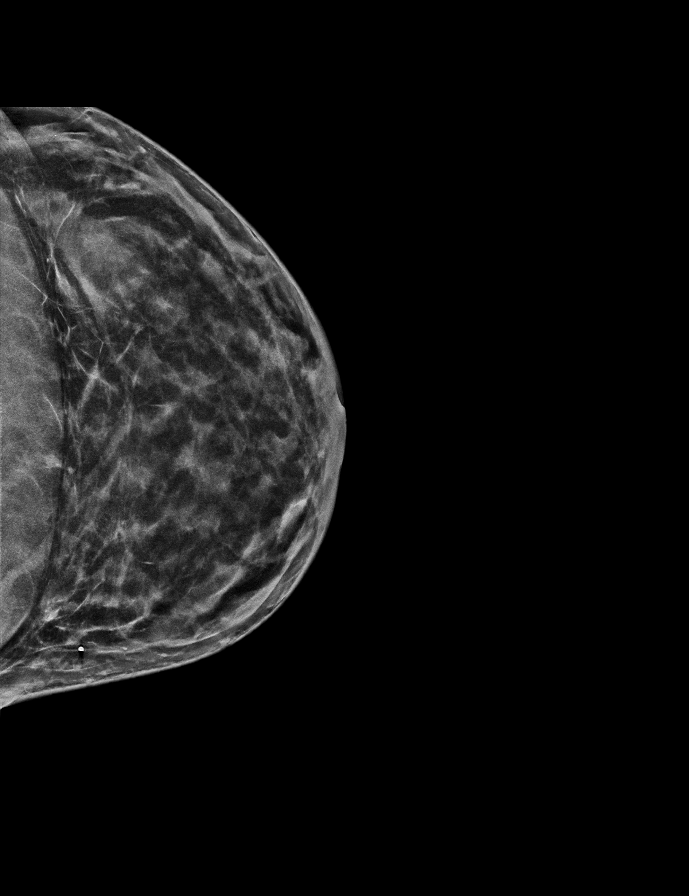

[L CC]
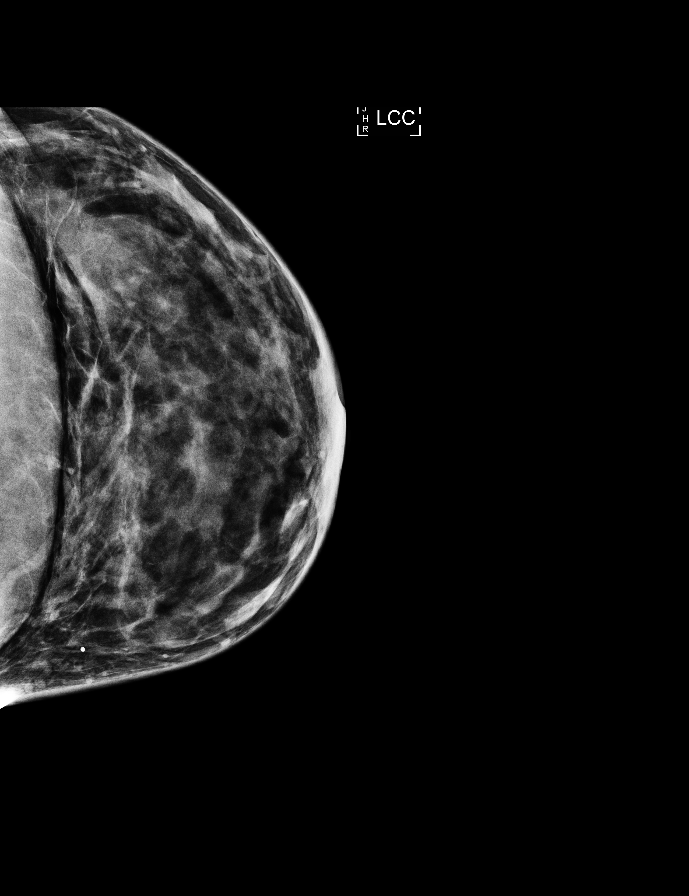

[8 of 35 positions shown; findings below may reference images not displayed]

ACR Breast Density Category c: The breast tissue is heterogeneously
dense, which may obscure small masses.
FINDINGS: A BB has been placed at the palpable site of concern just medial to
the right nipple. Deep to the palpable marker there is a
circumscribed irregular mass measuring approximately 3 cm. No other
suspicious calcifications, masses or areas of distortion are seen in
the bilateral breasts.

Mammographic images were processed with CAD.

A palpable mobile mass is identified in the right breast at 3
o'clock immediately adjacent to the nipple.

Ultrasound targeted to the palpable site demonstrates multiple
anechoic circumscribed clustered masses consistent with benign
cysts. Together, these measure up to 3.1 cm. Within one of these
masses, there is a possible mural nodule measuring approximately 3
mm. No blood flow was identified within this on color Doppler
imaging. With harmonics imaging, the apparent nodule is not seen,
and is possible that this could be artifactual. Ultrasound of the
right axilla demonstrates multiple normal-appearing lymph nodes.
IMPRESSION: 1. There is a possible mural nodule within one of the clustered
cysts at the palpable site in the right breast at 3 o'clock.

2.  No mammographic evidence of malignancy in the left breast.

RECOMMENDATION:
Ultrasound-guided aspiration is recommended for the cyst with the
possible mural nodule as I suspect that this is artifactual. If the
mass does not resolve, the patient is aware that we will transition
to biopsy. This has been scheduled for 01/25/2017 at [DATE] p.m..

I have discussed the findings and recommendations with the patient.
Results were also provided in writing at the conclusion of the
visit. If applicable, a reminder letter will be sent to the patient
regarding the next appointment.

BI-RADS CATEGORY  4: Suspicious.

## 2018-02-09 IMAGING — US US BREAST BX W LOC DEV 1ST LESION IMG BX SPEC US GUIDE*R*
1 series · 10 of 10 positions shown · non-contrast
Comparison: Previous exam(s).

ADDENDUM:
Pathology of the right breast biopsy revealed FIBROCYSTIC CHANGES.
NO EVIDENCE OF MALIGNANCY. Cytopathology of the fine needle
aspiration right breast 3:00 o'clock revealed NO MALIGNANT CELLS
IDENTIFIED.

Both biopsy and fine needle aspiration found to be concordant by Dr.
Resayapele.
Results of the biopsy was relayed to the patient by phone by Nimajneb
[place], RN on 01/26/17. The patient stated she has done well
following the biopsy with only mild tenderness. Post biopsy
instructions were reviewed with the patient. All of her questions
were answered. At the time of conversation, the cytology results
were pending. Cytology results and recommendations were relayed to
the patient by phone by Falke, Djole on 01/27/17. The patient
stated she continues to do well. She was encouraged to contact the
[REDACTED] with any further questions or
concerns.
Addendum by Falke, Djole on 01/27/17.
CLINICAL DATA: Patient presents today for ultrasound-guided core
biopsy. Biopsy is recommended for possible mural nodule within 1
component of a group of subareolar right breast cysts.
EXAM:
ULTRASOUND GUIDED RIGHT BREAST CORE NEEDLE BIOPSY

[Series 1: us breast bx w loc dev 1st lesion img bx spec us g · 0.06mm/px · 10 of 10 slices shown]
[im 1/10]
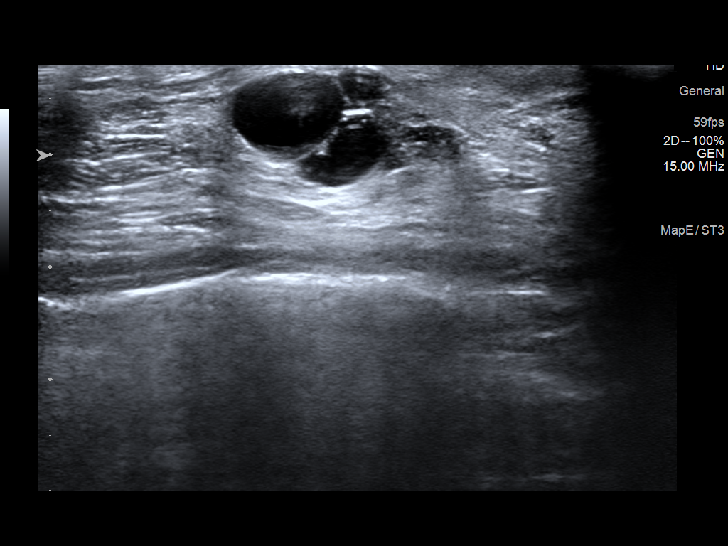
[im 2/10]
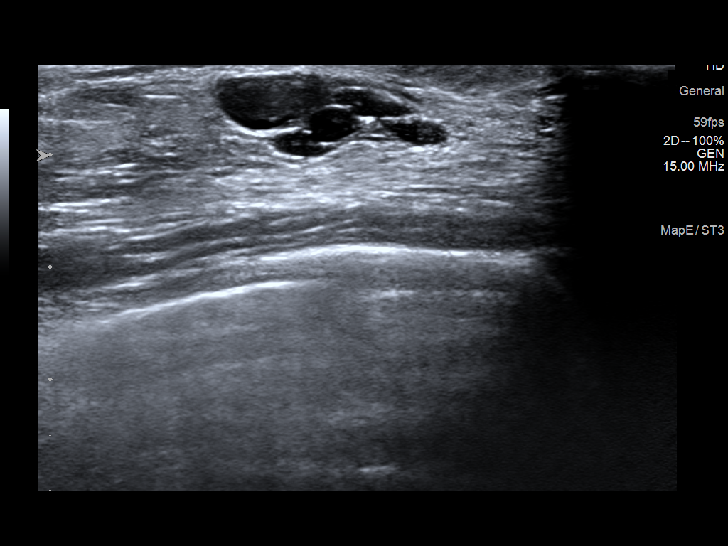
[im 3/10]
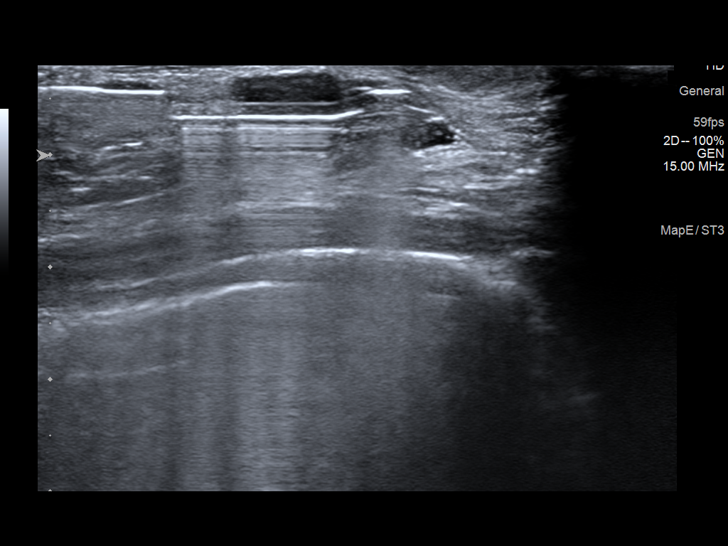
[im 4/10]
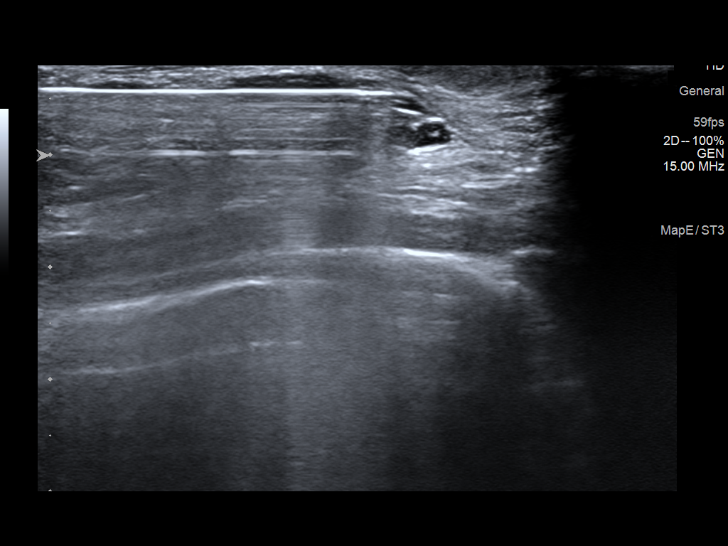
[im 5/10]
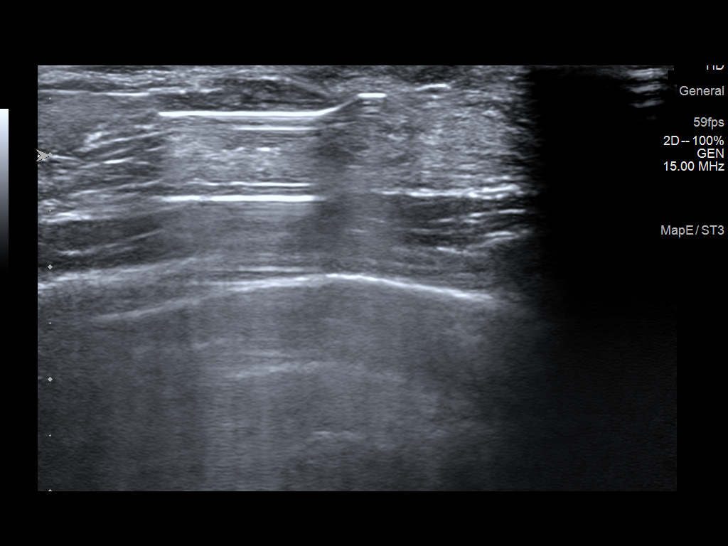
[im 6/10]
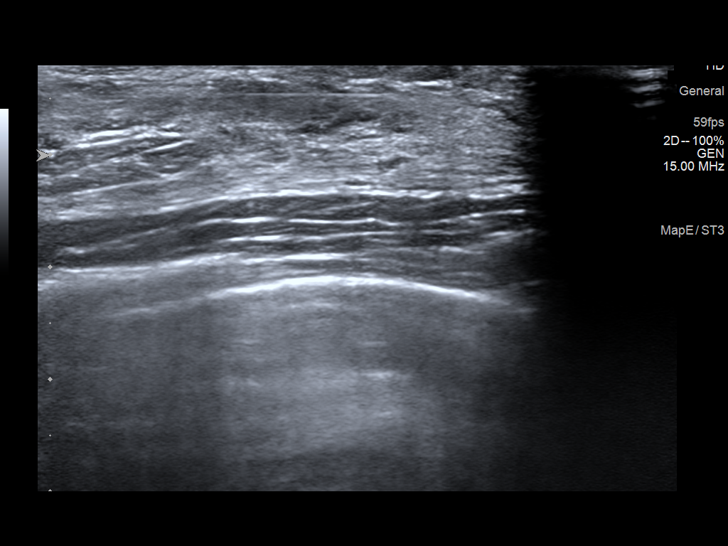
[im 7/10]
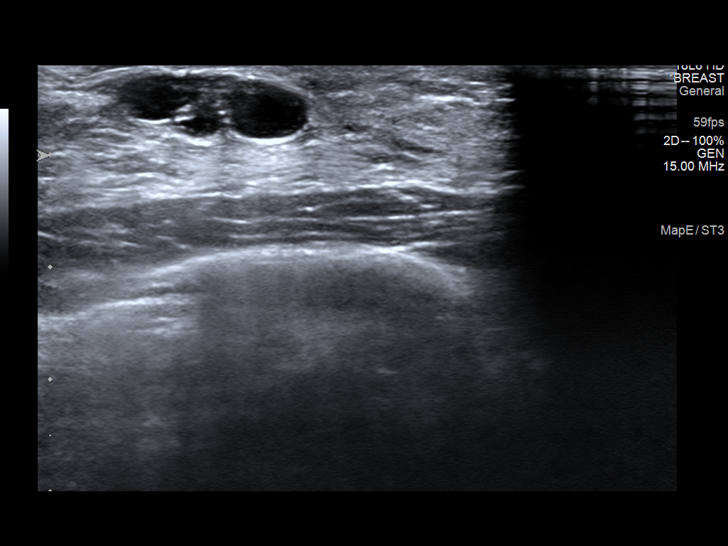
[im 8/10]
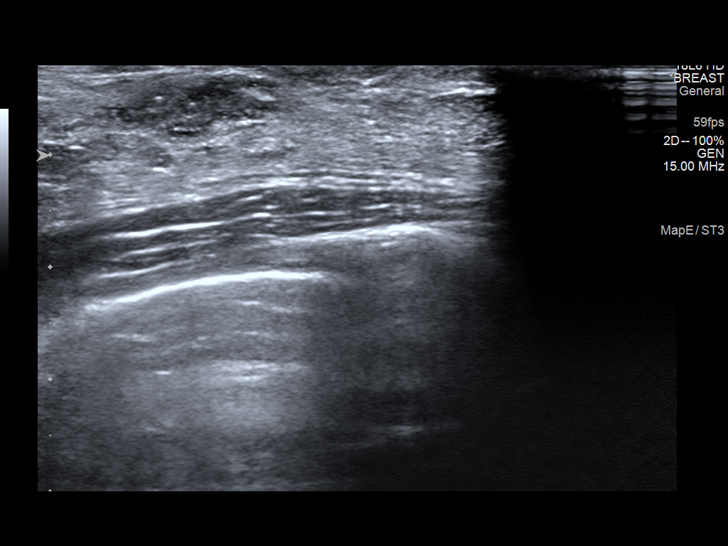
[im 9/10]
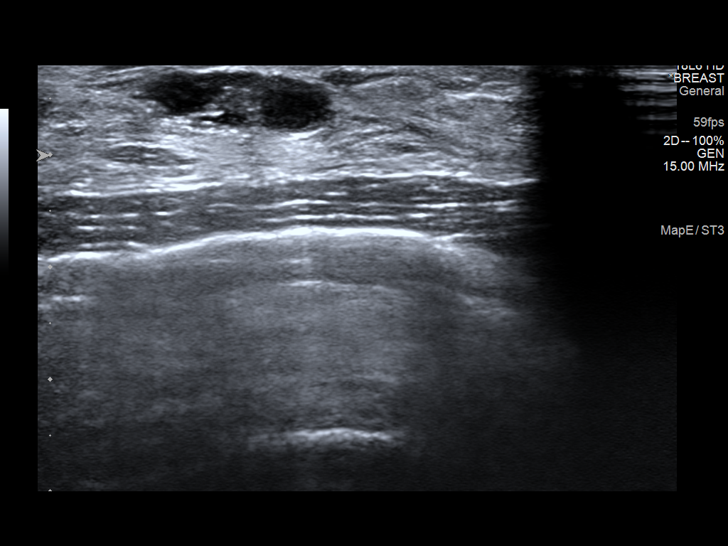
[im 10/10]
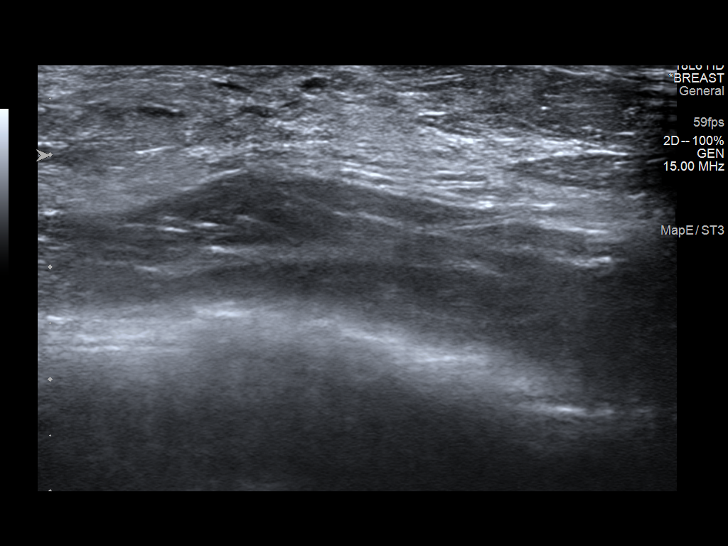

[10 of 10 positions shown; findings below may reference images not displayed]

PROCEDURE:
I met with the patient and we discussed the procedure of
ultrasound-guided biopsy, including benefits and alternatives. We
discussed the high likelihood of a successful procedure. We
discussed the risks of the procedure including infection, bleeding,
tissue injury, clip migration, and inadequate sampling. Informed
written consent was given. The usual time-out protocol was performed
immediately prior to the procedure.

Using sterile technique and 1% Lidocaine as local anesthetic, under
direct ultrasound visualization, a 12 gauge Aldama device was
used to perform biopsy of the mural nodule within the right breast
cyst at the 3 o'clock axis, retroareolar,using a medial approach. At
the conclusion of the procedure, a ribbon shaped tissue marker clip
was deployed into the biopsy cavity.

Aspiration was then performed for the remaining cystic components.
The aspirated material was sanguinous and sent to the laboratory for
cytology evaluation.

Follow-up 2-view mammogram was performed and dictated separately.
IMPRESSION: 1. Ultrasound-guided biopsy of the mural nodule within the
subareolar right breast cyst at the 3 o'clock axis.

2. Ultrasound-guided aspiration of the remaining cystic components
in the right breast at the 3 o'clock axis. Aspirated material was
bloody and sent for cytologic evaluation.

No apparent complications.

## 2019-09-09 ENCOUNTER — Ambulatory Visit (HOSPITAL_COMMUNITY)
Admission: EM | Admit: 2019-09-09 | Discharge: 2019-09-09 | Disposition: A | Discharge status: Home / Self Care | Payer: 59 | Attending: Family Medicine | Admitting: Family Medicine

## 2019-09-09 ENCOUNTER — Ambulatory Visit (INDEPENDENT_AMBULATORY_CARE_PROVIDER_SITE_OTHER): Payer: 59

## 2019-09-09 ENCOUNTER — Encounter (HOSPITAL_COMMUNITY): Payer: Self-pay

## 2019-09-09 ENCOUNTER — Other Ambulatory Visit: Payer: Self-pay

## 2019-09-09 DIAGNOSIS — U071 COVID-19: Secondary | ICD-10-CM | POA: Diagnosis not present

## 2019-09-09 DIAGNOSIS — R059 Cough, unspecified: Secondary | ICD-10-CM

## 2019-09-09 DIAGNOSIS — R0602 Shortness of breath: Secondary | ICD-10-CM | POA: Diagnosis not present

## 2019-09-09 DIAGNOSIS — R079 Chest pain, unspecified: Secondary | ICD-10-CM | POA: Diagnosis not present

## 2019-09-09 DIAGNOSIS — R05 Cough: Secondary | ICD-10-CM

## 2019-09-09 MED ORDER — HYDROCODONE-HOMATROPINE 5-1.5 MG/5ML PO SYRP: 5.0000 mL | ORAL_SOLUTION | Freq: Four times a day (QID) | ORAL | 0 refills | Status: AC | PRN

## 2019-09-09 NOTE — Discharge Instructions (Signed)
Be aware, your cough medication may cause drowsiness. Please do not drive, operate heavy machinery or make important decisions while on this medication, it can cloud your judgement.  

## 2019-09-09 NOTE — ED Provider Notes (Signed)
[facility]   [id number] 09/09/19 Arrival Time: 0946  ASSESSMENT & PLAN:  1. COVID-19 virus infection   2. Cough   3. SOB (shortness of breath)     I have personally viewed the imaging studies ordered this visit. Normal CXR.  Meds ordered this encounter  Medications  . HYDROcodone-homatropine (HYCODAN) 5-1.5 MG/5ML syrup    Sig: Take 5 mLs by mouth every 6 (six) hours as needed for cough.    Dispense:  120 mL    Refill:  0    May f/u here as needed.  Reviewed expectations re: course of current medical issues. Questions answered. Outlined signs and symptoms indicating need for more acute intervention. Understanding verbalized. After Visit Summary given.   SUBJECTIVE: History from: patient. Carol Robinson is a 39 y.o. female who tested + for COVID 4 d ago. Fatgued and coughing; coughing affecting sleep. Has felt SOB at times. Chest soreness with coughing. No current respiratory difficulites. Ambulatory without issue. Normal PO intake without n/v/d.    OBJECTIVE:  Vitals:   09/09/19 1044  BP: (!) 110/63  Pulse: 59  Resp: 16  Temp: 98.6 F (37 C)  TempSrc: Oral  SpO2: 99%    General appearance: alert; no distress HENT: [place]; AT; nasal congestion Neck: supple  Lungs: speaks full sentences without difficulty; unlabored; CTAB; actively coughing Skin: warm and dry Neurologic: normal gait Psychological: alert and cooperative; normal mood and affect   Imaging: DG Chest 2 View  Result Date: 09/09/2019 CLINICAL DATA:  Chest pain, shortness of breath EXAM: CHEST - 2 VIEW COMPARISON:  None. FINDINGS: The heart size and mediastinal contours are within normal limits. Both lungs are clear. The visualized skeletal structures are unremarkable. IMPRESSION: No active cardiopulmonary disease. Electronically Signed   By: Charlett Nose M.D.   On: 09/09/2019 11:08    No Known Allergies  Past Medical History:  Diagnosis Date  . Renal disorder    kidney stones    Social History   Socioeconomic History  . Marital status: Single    Spouse name: Not on file  . Number of children: Not on file  . Years of education: Not on file  . Highest education level: Not on file  Occupational History  . Not on file  Tobacco Use  . Smoking status: Never Smoker  Substance and Sexual Activity  . Alcohol use: No  . Drug use: No  . Sexual activity: Not on file  Other Topics Concern  . Not on file  Social History Narrative  . Not on file   Social Determinants of Health   Financial Resource Strain:   . Difficulty of Paying Living Expenses:   Food Insecurity:   . Worried About Programme researcher, broadcasting/film/video in the Last Year:   . Barista in the Last Year:   Transportation Needs:   . Freight forwarder (Medical):   Marland Kitchen Lack of Transportation (Non-Medical):   Physical Activity:   . Days of Exercise per Week:   . Minutes of Exercise per Session:   Stress:   . Feeling of Stress :   Social Connections:   . Frequency of Communication with Friends and Family:   . Frequency of Social Gatherings with Friends and Family:   . Attends Religious Services:   . Active Member of Clubs or Organizations:   . Attends Banker Meetings:   Marland Kitchen Marital Status:   Intimate Partner Violence:   . Fear of Current or Ex-Partner:   .  Emotionally Abused:   Marland Kitchen Physically Abused:   . Sexually Abused:    History reviewed. No pertinent family history. History reviewed. No pertinent surgical history.   Mardella Layman, MD 09/09/19 [phone]

## 2019-09-09 NOTE — ED Triage Notes (Signed)
Pt tested positive for covid on Friday 07/30; pt has non productive cough that causes burning in her chest when she coughs and shortness of breath.

## 2020-05-15 ENCOUNTER — Other Ambulatory Visit: Payer: Self-pay | Admitting: Obstetrics and Gynecology

## 2020-05-15 DIAGNOSIS — N63 Unspecified lump in unspecified breast: Secondary | ICD-10-CM

## 2020-05-26 ENCOUNTER — Ambulatory Visit: Payer: 59 | Admitting: Nurse Practitioner

## 2020-06-23 ENCOUNTER — Other Ambulatory Visit: Payer: 59

## 2020-09-23 IMAGING — DX DG CHEST 2V
2 series · 2 of 2 positions shown · non-contrast
Comparison: None.

CLINICAL DATA: Chest pain, shortness of breath

EXAM:
CHEST - 2 VIEW

[chest pa]
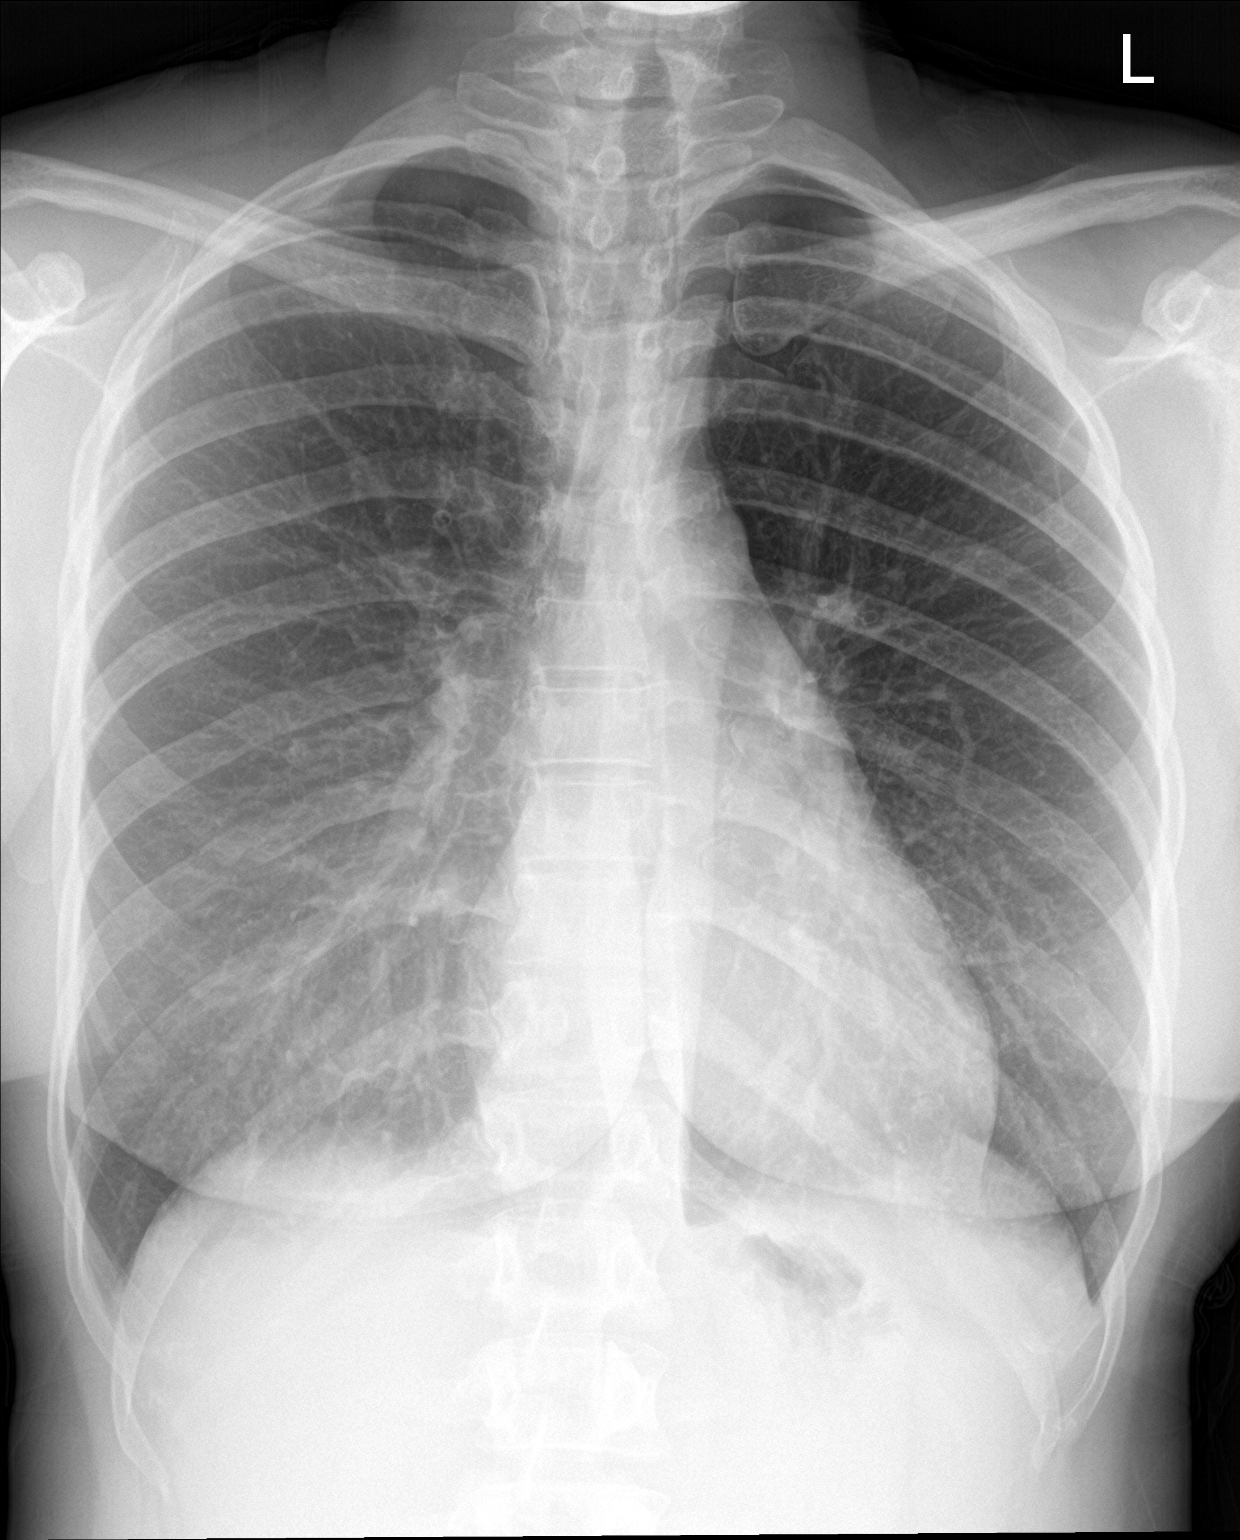

[chest lat]
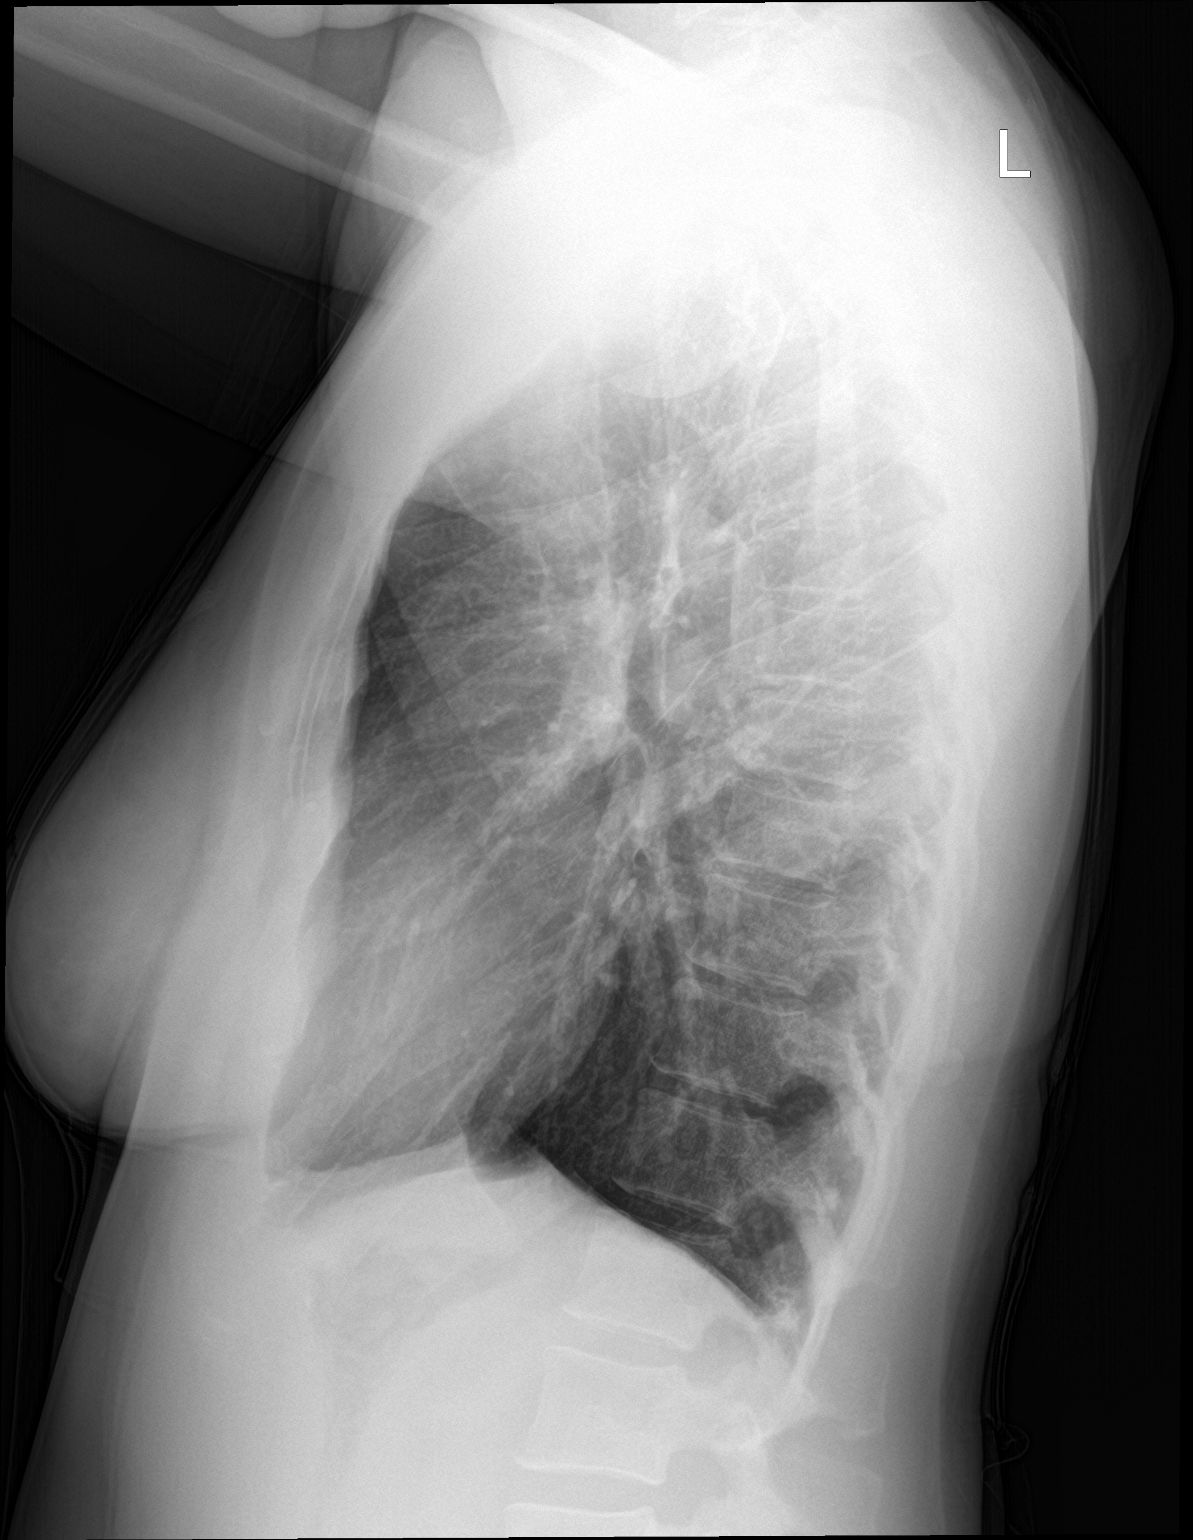

[2 of 2 positions shown; findings below may reference images not displayed]

FINDINGS: The heart size and mediastinal contours are within normal limits.
Both lungs are clear. The visualized skeletal structures are
unremarkable.
IMPRESSION: No active cardiopulmonary disease.
